# Patient Record
Sex: Female | Born: 1935 | Race: Black or African American | Hispanic: No | Smoking: Never smoker
Health system: Southern US, Community
[De-identification: ages and names within clinical notes are randomized; demographics above are authoritative.]

## PROBLEM LIST (undated history)

## (undated) DIAGNOSIS — I639 Cerebral infarction, unspecified: Secondary | ICD-10-CM

## (undated) DIAGNOSIS — M1990 Unspecified osteoarthritis, unspecified site: Secondary | ICD-10-CM

## (undated) DIAGNOSIS — I10 Essential (primary) hypertension: Secondary | ICD-10-CM

## (undated) HISTORY — PX: ABDOMINAL HYSTERECTOMY: SHX81

## (undated) HISTORY — PX: CSF SHUNT: SHX92

---

## 2016-05-07 ENCOUNTER — Encounter (HOSPITAL_COMMUNITY): Payer: Self-pay | Admitting: *Deleted

## 2016-05-07 DIAGNOSIS — R1011 Right upper quadrant pain: Secondary | ICD-10-CM | POA: Insufficient documentation

## 2016-05-07 DIAGNOSIS — R1031 Right lower quadrant pain: Secondary | ICD-10-CM | POA: Insufficient documentation

## 2016-05-07 DIAGNOSIS — I10 Essential (primary) hypertension: Secondary | ICD-10-CM | POA: Diagnosis not present

## 2016-05-07 DIAGNOSIS — Z8673 Personal history of transient ischemic attack (TIA), and cerebral infarction without residual deficits: Secondary | ICD-10-CM | POA: Diagnosis not present

## 2016-05-07 LAB — COMPREHENSIVE METABOLIC PANEL
ALK PHOS: 78 U/L (ref 38–126)
ALT: 11 U/L — AB (ref 14–54)
ANION GAP: 11 (ref 5–15)
AST: 14 U/L — ABNORMAL LOW (ref 15–41)
Albumin: 3.9 g/dL (ref 3.5–5.0)
BUN: 16 mg/dL (ref 6–20)
CALCIUM: 9.5 mg/dL (ref 8.9–10.3)
CO2: 24 mmol/L (ref 22–32)
CREATININE: 1.32 mg/dL — AB (ref 0.44–1.00)
Chloride: 103 mmol/L (ref 101–111)
GFR, EST AFRICAN AMERICAN: 41 mL/min — AB (ref >60)
GFR, EST NON AFRICAN AMERICAN: 36 mL/min — AB (ref >60)
Glucose, Bld: 102 mg/dL — ABNORMAL HIGH (ref 65–99)
Potassium: 3.6 mmol/L (ref 3.5–5.1)
SODIUM: 138 mmol/L (ref 135–145)
Total Bilirubin: 0.6 mg/dL (ref 0.3–1.2)
Total Protein: 7.2 g/dL (ref 6.5–8.1)

## 2016-05-07 LAB — URINE MICROSCOPIC-ADD ON

## 2016-05-07 LAB — URINALYSIS, ROUTINE W REFLEX MICROSCOPIC
Bilirubin Urine: NEGATIVE
Glucose, UA: NEGATIVE mg/dL
Hgb urine dipstick: NEGATIVE
Ketones, ur: NEGATIVE mg/dL
LEUKOCYTES UA: NEGATIVE
NITRITE: NEGATIVE
PROTEIN: 30 mg/dL — AB
SPECIFIC GRAVITY, URINE: 1.02 (ref 1.005–1.030)
pH: 6 (ref 5.0–8.0)

## 2016-05-07 LAB — CBC
HCT: 34.3 % — ABNORMAL LOW (ref 36.0–46.0)
HEMOGLOBIN: 10.6 g/dL — AB (ref 12.0–15.0)
MCH: 28.2 pg (ref 26.0–34.0)
MCHC: 30.9 g/dL (ref 30.0–36.0)
MCV: 91.2 fL (ref 78.0–100.0)
PLATELETS: 184 10*3/uL (ref 150–400)
RBC: 3.76 MIL/uL — AB (ref 3.87–5.11)
RDW: 16.3 % — ABNORMAL HIGH (ref 11.5–15.5)
WBC: 7.1 10*3/uL (ref 4.0–10.5)

## 2016-05-07 LAB — LIPASE, BLOOD: LIPASE: 36 U/L (ref 11–51)

## 2016-05-07 NOTE — ED Triage Notes (Signed)
Pt c/o intermittent sharp right side abdominal pain starting today. Pt was sitting on the couch when pain started. Last BM yesterday. Pt denies dysuria, hematuria.

## 2016-05-08 ENCOUNTER — Emergency Department (HOSPITAL_COMMUNITY)
Admission: EM | Admit: 2016-05-08 | Discharge: 2016-05-08 | Disposition: A | Discharge status: Home / Self Care | Payer: Medicare Other | Attending: Emergency Medicine | Admitting: Emergency Medicine

## 2016-05-08 ENCOUNTER — Emergency Department (HOSPITAL_COMMUNITY): Payer: Medicare Other

## 2016-05-08 DIAGNOSIS — R109 Unspecified abdominal pain: Secondary | ICD-10-CM

## 2016-05-08 DIAGNOSIS — R1031 Right lower quadrant pain: Secondary | ICD-10-CM | POA: Diagnosis not present

## 2016-05-08 HISTORY — DX: Unspecified osteoarthritis, unspecified site: M19.90

## 2016-05-08 HISTORY — DX: Cerebral infarction, unspecified: I63.9

## 2016-05-08 HISTORY — DX: Essential (primary) hypertension: I10

## 2016-05-08 MED ORDER — FENTANYL CITRATE (PF) 100 MCG/2ML IJ SOLN
25.0000 ug | Freq: Once | INTRAMUSCULAR | Status: AC
  Administered 2016-05-08: 25 ug via INTRAVENOUS
  Filled 2016-05-08: qty 2

## 2016-05-08 MED ORDER — IOPAMIDOL (ISOVUE-300) INJECTION 61%
INTRAVENOUS | Status: AC
  Administered 2016-05-08: 80 mL
  Filled 2016-05-08: qty 100

## 2016-05-08 MED ORDER — ONDANSETRON HCL 4 MG/2ML IJ SOLN
4.0000 mg | Freq: Once | INTRAMUSCULAR | Status: AC
  Administered 2016-05-08: 4 mg via INTRAVENOUS
  Filled 2016-05-08: qty 2

## 2016-05-08 NOTE — Discharge Instructions (Signed)
Take tylenol for pain. Follow up with your doctor closely. Return if pain worsening.

## 2016-05-08 NOTE — ED Provider Notes (Signed)
[facility] DEPT Provider Note   CSN: [id number] Arrival date & time: 05/07/16  1939     History   Chief Complaint Chief Complaint  Patient presents with  . Abdominal Pain    HPI Elizabeth Hubbard is a 82 y.o. female.  HPI Elizabeth Hubbard is a 82 y.o. female presents to emergency department with complaint of right lower abdominal pain. Patient states pain started several days ago, but worsened today. She denies any urinary symptoms. Had a bowel movement in the waiting room that was normal. Denies any nausea or vomiting. States pain comes and goes, describes pain as sharp. States movement does make pain worse. Did not take any medications for pain prior to coming in. Patient does report history of kidney stones, and does not remember what it felt like. 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  Past Medical History:  Diagnosis Date  . Arthritis   . Hypertension   . Stroke [facility])     There are no active problems to display for this patient.   Past Surgical History:  Procedure Laterality Date  . ABDOMINAL HYSTERECTOMY    . CSF SHUNT      OB History    No data available       Home Medications    Prior to Admission medications   Not on File    Family History History reviewed. No pertinent family history.  Social History Social History  Substance Use Topics  . Smoking status: Never Smoker  . Smokeless tobacco: Never Used  . Alcohol use No     Allergies   Review of patient's allergies indicates not on file.   Review of Systems Review of Systems  Constitutional: Negative for chills and fever.  Respiratory: Negative for cough, chest tightness and shortness of breath.   Cardiovascular: Negative for chest pain, palpitations and leg swelling.  Gastrointestinal: Positive for abdominal pain. Negative for diarrhea, nausea and vomiting.  Genitourinary: Negative for dysuria, flank pain, pelvic pain, vaginal bleeding, vaginal discharge and vaginal pain.    Musculoskeletal: Negative for arthralgias, myalgias, neck pain and neck stiffness.  Skin: Negative for rash.  Neurological: Negative for dizziness, weakness and headaches.  All other systems reviewed and are negative.    Physical Exam Updated Vital Signs BP 152/75   Pulse 83   Temp 97.9 F (36.6 C) (Oral)   Resp 16   Ht 5' (1.524 m)   Wt 46.7 kg   SpO2 100%   BMI 20.12 kg/m   Physical Exam  Constitutional: She appears well-developed and well-nourished. No distress.  HENT:  Head: Normocephalic.  Eyes: Conjunctivae are normal.  Neck: Neck supple.  Cardiovascular: Normal rate, regular rhythm and normal heart sounds.   Pulmonary/Chest: Effort normal and breath sounds normal. No respiratory distress. She has no wheezes. She has no rales.  Abdominal: Soft. Bowel sounds are normal. She exhibits no distension. There is tenderness. There is no rebound and no guarding.  Right upper quadrant and right lower quadrant tenderness. Right CVA tenderness.  Musculoskeletal: She exhibits no edema.  Neurological: She is alert.  Skin: Skin is warm and dry.  Psychiatric: She has a normal mood and affect. Her behavior is normal.  Nursing note and vitals reviewed.    ED Treatments / Results  Labs (all labs ordered are listed, but only abnormal results are displayed) Labs Reviewed  COMPREHENSIVE METABOLIC PANEL - Abnormal; Notable for the following:       Result Value   Glucose, Bld  102 (*)    Creatinine, Ser 1.32 (*)    AST 14 (*)    ALT 11 (*)    GFR calc non Af Amer 36 (*)    GFR calc Af Amer 41 (*)    All other components within normal limits  CBC - Abnormal; Notable for the following:    RBC 3.76 (*)    Hemoglobin 10.6 (*)    HCT 34.3 (*)    RDW 16.3 (*)    All other components within normal limits  URINALYSIS, ROUTINE W REFLEX MICROSCOPIC (NOT AT [facility]) - Abnormal; Notable for the following:    Protein, ur 30 (*)    All other components within normal limits  URINE  MICROSCOPIC-ADD ON - Abnormal; Notable for the following:    Squamous Epithelial / LPF 0-5 (*)    Bacteria, UA RARE (*)    All other components within normal limits  LIPASE, BLOOD    EKG  EKG Interpretation None       Radiology Ct Abdomen Pelvis W Contrast  Result Date: 05/08/2016 CLINICAL DATA:  Right lower abdominal pain, onset today. EXAM: CT ABDOMEN AND PELVIS WITH CONTRAST TECHNIQUE: Multidetector CT imaging of the abdomen and pelvis was performed using the standard protocol following bolus administration of intravenous contrast. CONTRAST:  80mL ISOVUE-300 IOPAMIDOL (ISOVUE-300) INJECTION 61% COMPARISON:  None. FINDINGS: Lower chest:  No significant abnormality Hepatobiliary: There are normal appearances of the liver, gallbladder and bile ducts. Pancreas: Normal Spleen: Normal Adrenals/Urinary Tract: Both adrenals are normal. There is a 3 mm lower pole collecting system calculus in each kidney, nonobstructing. There is a 1.5 cm lower pole left renal cyst. No significant parenchymal renal lesion. Ureters are unremarkable. Urinary bladder is unremarkable. Stomach/Bowel: There are normal appearances of the stomach, small bowel and colon. The appendix is normal. Vascular/Lymphatic: The abdominal aorta is normal in caliber. There is mild atherosclerotic calcification. There is no adenopathy in the abdomen or pelvis. Reproductive: Prior hysterectomy.  No adnexal abnormalities. Other: Ventricular peritoneal shunt tubing noted. Musculoskeletal: No significant skeletal lesion. Moderately severe lower lumbar degenerative facet disease and degenerative disc disease is present. Grade 1 degenerative appearing spondylolisthesis at L3-4. IMPRESSION: 1. No acute findings are evident in the abdomen or pelvis. Normal appendix. 2. Lower pole nephrolithiasis bilaterally. Electronically Signed   By: Ellery Plunkaniel R Mitchell M.D.   On: 05/08/2016 04:02    Procedures Procedures (including critical care  time)  Medications Ordered in ED Medications  fentaNYL (SUBLIMAZE) injection 25 mcg (not administered)  ondansetron (ZOFRAN) injection 4 mg (not administered)     Initial Impression / Assessment and Plan / ED Course  I have reviewed the triage vital signs and the nursing notes.  Pertinent labs & imaging results that were available during my care of the patient were reviewed by me and considered in my medical decision making (see chart for details).  Clinical Course    1:58 AM Pt with lower abdominal pain in the last several days. Pt's labs unremarkable. UA with no signs if infection or hematuria. Will get CT abd/plevis for evaluation. Pain medications ordered.   4:37 AM CT is negative. Pt feels much better. No pain at this time. Question passed kidney stone. Will dc home with tylenol. Follow u with primary care doctor as needed return precautions discussed. At time of discharge, abdomen is soft, non tender. VS show hypertension, otherwise normal. She is afebrile.   Vitals:   05/07/16 1955 05/07/16 2219 05/08/16 0200 05/08/16 0420  BP: 177/65  152/75 161/75 162/98  Pulse: 94 83 82 92  Resp: 20 16 16 16   Temp: 97.9 F (36.6 C)     TempSrc: Oral     SpO2: 100% 100% 100% 100%  Weight: 46.7 kg     Height: 5' (1.524 m)        Final Clinical Impressions(s) / ED Diagnoses   Final diagnoses:  Abdominal pain, unspecified abdominal location    New Prescriptions New Prescriptions   No medications on file     Jaynie Crumble, PA-C 05/08/16 1610    Shon Baton, MD 05/16/16 [phone]

## 2016-05-08 NOTE — ED Notes (Signed)
Patient transported to CT scan . 

## 2016-05-10 ENCOUNTER — Emergency Department (HOSPITAL_COMMUNITY)
Admission: EM | Admit: 2016-05-10 | Discharge: 2016-05-10 | Disposition: A | Discharge status: Home / Self Care | Payer: Medicare Other | Attending: Emergency Medicine | Admitting: Emergency Medicine

## 2016-05-10 ENCOUNTER — Emergency Department (HOSPITAL_COMMUNITY): Payer: Medicare Other

## 2016-05-10 ENCOUNTER — Encounter (HOSPITAL_COMMUNITY): Payer: Self-pay | Admitting: Emergency Medicine

## 2016-05-10 DIAGNOSIS — I10 Essential (primary) hypertension: Secondary | ICD-10-CM | POA: Insufficient documentation

## 2016-05-10 DIAGNOSIS — R1011 Right upper quadrant pain: Secondary | ICD-10-CM

## 2016-05-10 DIAGNOSIS — Z8673 Personal history of transient ischemic attack (TIA), and cerebral infarction without residual deficits: Secondary | ICD-10-CM | POA: Insufficient documentation

## 2016-05-10 DIAGNOSIS — K824 Cholesterolosis of gallbladder: Secondary | ICD-10-CM | POA: Diagnosis not present

## 2016-05-10 LAB — COMPREHENSIVE METABOLIC PANEL
ALT: 11 U/L — AB (ref 14–54)
AST: 20 U/L (ref 15–41)
Albumin: 4 g/dL (ref 3.5–5.0)
Alkaline Phosphatase: 73 U/L (ref 38–126)
Anion gap: 11 (ref 5–15)
BILIRUBIN TOTAL: 0.3 mg/dL (ref 0.3–1.2)
BUN: 17 mg/dL (ref 6–20)
CALCIUM: 9.8 mg/dL (ref 8.9–10.3)
CO2: 24 mmol/L (ref 22–32)
CREATININE: 1.34 mg/dL — AB (ref 0.44–1.00)
Chloride: 104 mmol/L (ref 101–111)
GFR, EST AFRICAN AMERICAN: 41 mL/min — AB (ref >60)
GFR, EST NON AFRICAN AMERICAN: 35 mL/min — AB (ref >60)
Glucose, Bld: 95 mg/dL (ref 65–99)
Potassium: 4.2 mmol/L (ref 3.5–5.1)
Sodium: 139 mmol/L (ref 135–145)
TOTAL PROTEIN: 7.9 g/dL (ref 6.5–8.1)

## 2016-05-10 LAB — URINALYSIS, ROUTINE W REFLEX MICROSCOPIC
Bilirubin Urine: NEGATIVE
GLUCOSE, UA: NEGATIVE mg/dL
Hgb urine dipstick: NEGATIVE
KETONES UR: NEGATIVE mg/dL
LEUKOCYTES UA: NEGATIVE
NITRITE: NEGATIVE
PROTEIN: 30 mg/dL — AB
Specific Gravity, Urine: 1.017 (ref 1.005–1.030)
pH: 6 (ref 5.0–8.0)

## 2016-05-10 LAB — URINE MICROSCOPIC-ADD ON: Bacteria, UA: NONE SEEN

## 2016-05-10 LAB — CBC
HCT: 36.4 % (ref 36.0–46.0)
Hemoglobin: 11.4 g/dL — ABNORMAL LOW (ref 12.0–15.0)
MCH: 28.4 pg (ref 26.0–34.0)
MCHC: 31.3 g/dL (ref 30.0–36.0)
MCV: 90.8 fL (ref 78.0–100.0)
PLATELETS: 172 10*3/uL (ref 150–400)
RBC: 4.01 MIL/uL (ref 3.87–5.11)
RDW: 16.2 % — AB (ref 11.5–15.5)
WBC: 6.5 10*3/uL (ref 4.0–10.5)

## 2016-05-10 LAB — LIPASE, BLOOD: Lipase: 41 U/L (ref 11–51)

## 2016-05-10 MED ORDER — TRAMADOL HCL 50 MG PO TABS: 50.0000 mg | ORAL_TABLET | Freq: Three times a day (TID) | ORAL | 0 refills | Status: AC | PRN

## 2016-05-10 MED ORDER — DOCUSATE SODIUM 100 MG PO CAPS: 100.0000 mg | ORAL_CAPSULE | Freq: Two times a day (BID) | ORAL | 0 refills | Status: AC

## 2016-05-10 NOTE — ED Triage Notes (Signed)
Pt state she was seen here a few days ago for the same thing, had labs, and CT scan done then without finding anything. Pt told to follow up with PCP and is from out of town and has been unable to do that. Pt c/o continued pain.

## 2016-05-10 NOTE — ED Notes (Signed)
Pt voided in brief. Brief changed, perineal area cleaned with warm washcloth, new brief applied with chuck underneath.

## 2016-05-10 NOTE — ED Provider Notes (Signed)
[facility] Provider Note   CSN: [id number] Arrival date & time: 05/10/16  1352     History   Chief Complaint Chief Complaint  Patient presents with  . Abdominal Pain    HPI Elizabeth Hubbard is a 82 y.o. female.  The history is provided by the patient and a relative.  Abdominal Pain   This is a new problem. The current episode started more than 2 days ago. The problem occurs daily. The problem has been gradually worsening. The pain is located in the RUQ. The pain is moderate. Associated symptoms include dysuria. Pertinent negatives include fever. The symptoms are aggravated by certain positions. Nothing relieves the symptoms.   Pt presents with abdominal pain She was seen for this pain in the ED earlier this week CT imaging was negative and she felt improved in the ED Her pain is now returned It is mostly in the RUQ No fever/vomiting No cp/sob No constipation She has never had this before  She is from [place] area, no local PCP She is here visiting family  Past Medical History:  Diagnosis Date  . Arthritis   . Hypertension   . Stroke [facility])     There are no active problems to display for this patient.   Past Surgical History:  Procedure Laterality Date  . ABDOMINAL HYSTERECTOMY    . CSF SHUNT      OB History    No data available       Home Medications    Prior to Admission medications   Medication Sig Start Date End Date Taking? Authorizing Provider  amLODipine (NORVASC) 5 MG tablet Take 5 mg by mouth daily.    Historical Provider, MD  atorvastatin (LIPITOR) 40 MG tablet Take 40 mg by mouth daily.    Historical Provider, MD  ferrous sulfate 325 (65 FE) MG tablet Take 325 mg by mouth daily with breakfast.    Historical Provider, MD  lacosamide (VIMPAT) 50 MG TABS tablet Take 50 mg by mouth 2 (two) times daily.    Historical Provider, MD  Multiple Vitamin (MULTIVITAMIN WITH MINERALS) TABS tablet Take 1 tablet by mouth daily.     Historical Provider, MD  polyethylene glycol (MIRALAX / GLYCOLAX) packet Take 17 g by mouth daily.    Historical Provider, MD    Family History No family history on file.  Social History Social History  Substance Use Topics  . Smoking status: Never Smoker  . Smokeless tobacco: Never Used  . Alcohol use No     Allergies   Aspirin; Diovan hct [valsartan-hydrochlorothiazide]; Doxycycline; Keflex [cephalexin]; Lisinopril; Penicillins; Percocet [oxycodone-acetaminophen]; Tetracyclines & related; and Tylenol with codeine #3 [acetaminophen-codeine]   Review of Systems Review of Systems  Constitutional: Negative for fever.  Cardiovascular: Negative for chest pain.  Gastrointestinal: Positive for abdominal pain. Negative for blood in stool.  Genitourinary: Positive for dysuria.  All other systems reviewed and are negative.    Physical Exam Updated Vital Signs BP 152/70   Pulse 86   Temp 97.9 F (36.6 C) (Oral)   Resp 18   Ht 5' (1.524 m)   Wt 46.7 kg   SpO2 100%   BMI 20.12 kg/m   Physical Exam CONSTITUTIONAL: Elderly, no acute distress HEAD: Normocephalic/atraumatic EYES: EOMI ENMT: Mucous membranes moist NECK: supple no meningeal signs SPINE/BACK:entire spine nontender CV: S1/S2 noted, no murmurs/rubs/gallops noted LUNGS: Lungs are clear to auscultation bilaterally, no apparent distress ABDOMEN: soft, moderate RUQ tenderness, no rebound or guarding, bowel sounds noted  throughout abdomen GU:no cva tenderness NEURO: Pt is awake/alert/appropriate, moves all extremitiesx4. EXTREMITIES: pulses normal/equal, full ROM SKIN: warm, color normal PSYCH: no abnormalities of mood noted, alert and oriented to situation   ED Treatments / Results  Labs (all labs ordered are listed, but only abnormal results are displayed) Labs Reviewed  COMPREHENSIVE METABOLIC PANEL - Abnormal; Notable for the following:       Result Value   Creatinine, Ser 1.34 (*)    ALT 11 (*)    GFR  calc non Af Amer 35 (*)    GFR calc Af Amer 41 (*)    All other components within normal limits  CBC - Abnormal; Notable for the following:    Hemoglobin 11.4 (*)    RDW 16.2 (*)    All other components within normal limits  URINALYSIS, ROUTINE W REFLEX MICROSCOPIC (NOT AT [facility]) - Abnormal; Notable for the following:    Protein, ur 30 (*)    All other components within normal limits  URINE MICROSCOPIC-ADD ON - Abnormal; Notable for the following:    Squamous Epithelial / LPF 0-5 (*)    All other components within normal limits  LIPASE, BLOOD    EKG  EKG Interpretation  Date/Time:  Thursday May 10 2016 18:02:33 EDT Ventricular Rate:  76 PR Interval:    QRS Duration: 92 QT Interval:  414 QTC Calculation: 466 R Axis:   77 Text Interpretation:  Sinus rhythm Atrial premature complex Probable anteroseptal infarct, old No previous ECGs available Confirmed by Bebe Shaggy  MD, Sandi Towe ([id number]) on 05/10/2016 6:40:09 PM       Radiology US Abdomen Limited Ruq  Result Date: 05/10/2016 CLINICAL DATA:  Right upper quadrant abdominal pain for 4 days. EXAM: US ABDOMEN LIMITED - RIGHT UPPER QUADRANT COMPARISON:  05/08/2016 CT scan FINDINGS: Gallbladder: No gallstones or wall thickening visualized. No sonographic Murphy sign noted by sonographer. There is a suspected 2 mm polyp in the neck of the gallbladder on image 12. Common bile duct: Diameter: 2 mm Liver: No focal lesion identified. Within normal limits in parenchymal echogenicity. Other: Note is made is some prominence of bowel contents, correlating with appearance of potential constipation on CT. IMPRESSION: 1. Prominence of bowel contents, review of CT raises the possibility of constipation, correlate with bowel habits. 2. 2 mm polyp in the neck of the gallbladder. 3. Otherwise normal sonographic appearance of the hepatobiliary system. Electronically Signed   By: Gaylyn Rong M.D.   On: 05/10/2016 18:56    Procedures Procedures  (including critical care time)  Medications Ordered in ED Medications - No data to display   Initial Impression / Assessment and Plan / ED Course  I have reviewed the triage vital signs and the nursing notes.  Pertinent labs  results that were available during my care of the patient were reviewed by me and considered in my medical decision making (see chart for details).  Clinical Course    Pt with continued abd pain, now localizing in RUQ Will obtain ultrasound Pt agreeable She does not want pain meds at this time    Pt stable in the ED US imaging reveals probable constipation as well as gallbladder polyp which could explain her pain symptoms At time of discharge, pt well appearing/no distress/smiling Daughters at bedside Will give short course of pain meds (she feels she can tolerate tramadol) Also recommend colace or other stool softener She will f/u with her PCP back in [place] when she returns in September   Final  Clinical Impressions(s) / ED Diagnoses   Final diagnoses:  RUQ pain  Gallbladder polyp    New Prescriptions New Prescriptions   No medications on file     Zadie Rhineonald Kathaleya Mcduffee, MD 05/11/16 0009

## 2016-05-10 NOTE — Discharge Instructions (Signed)
YOU HAVE A GALLBLADDER POLYP PLEASE SEE YOUR PRIMARY DOCTOR IN [place] FOR FURTHER TESTING/MANAGEMENT    SEEK IMMEDIATE MEDICAL ATTENTION IF: The pain does not go away or becomes severe, particularly over the next 8-12 hours.  A temperature above 100.22F develops.  Repeated vomiting occurs (multiple episodes).   Blood is being passed in stools or vomit (bright red or black tarry stools).  Return also if you develop chest pain, difficulty breathing, dizziness or fainting, or become confused, poorly responsive, or inconsolable.

## 2016-12-30 IMAGING — US US ABDOMEN LIMITED
1 series · 14 of 25 positions shown · non-contrast
Comparison: 05/08/2016 CT scan

CLINICAL DATA: Right upper quadrant abdominal pain for 4 days.

EXAM:
US ABDOMEN LIMITED - RIGHT UPPER QUADRANT

[Series 1: us abdomen limited · 0.15mm/px · 14 of 51 slices shown]
[im 1/51]
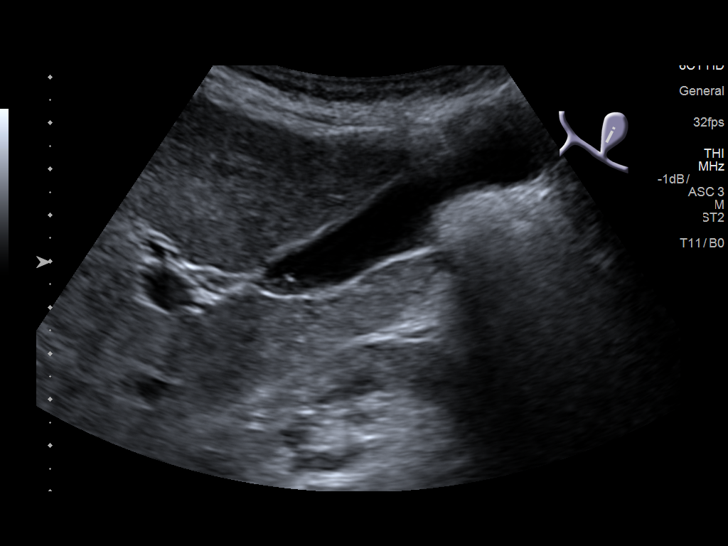
[im 5/51]
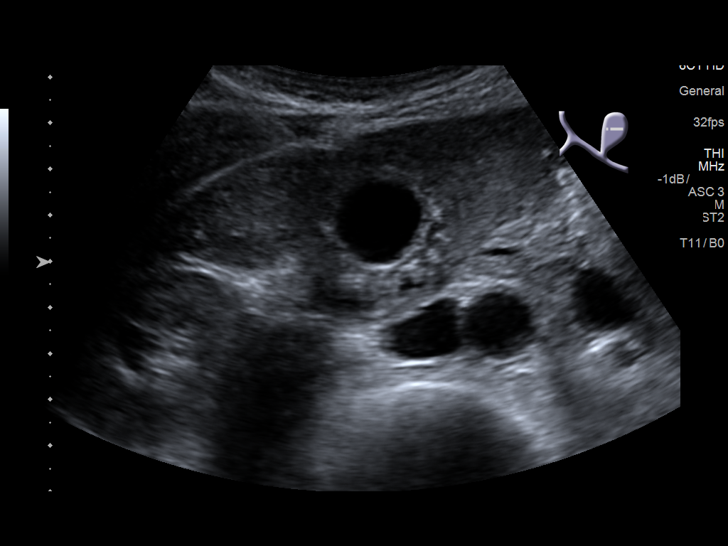
[im 9/51]
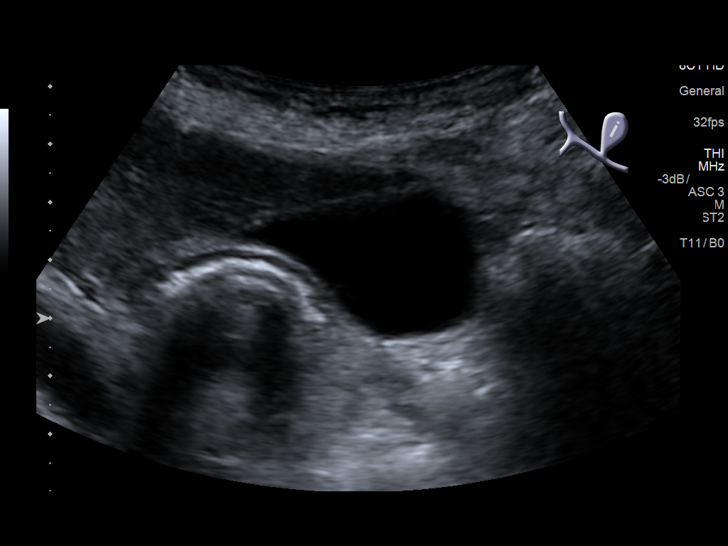
[im 13/51]
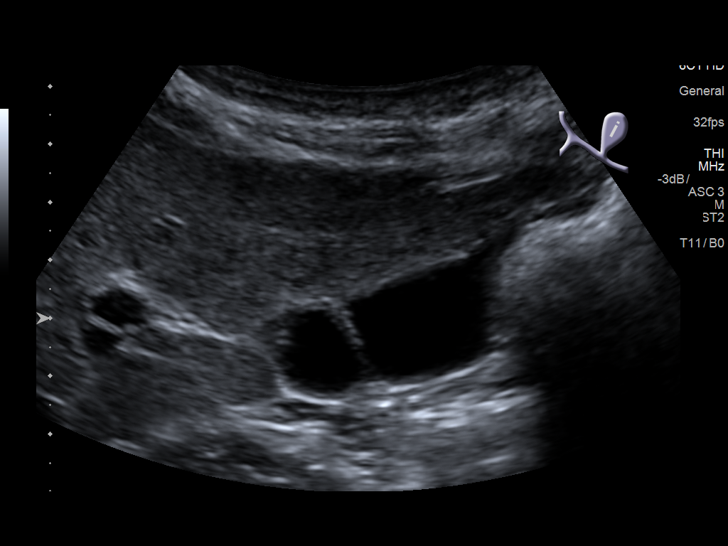
[im 17/51]
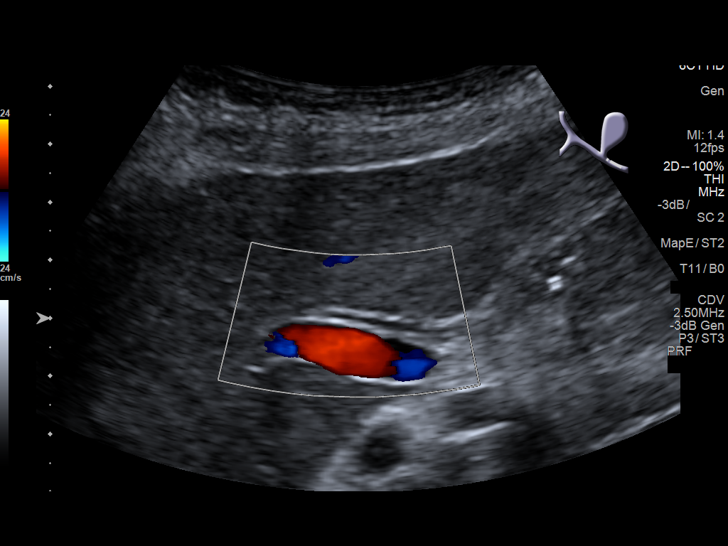
[im 19/51]
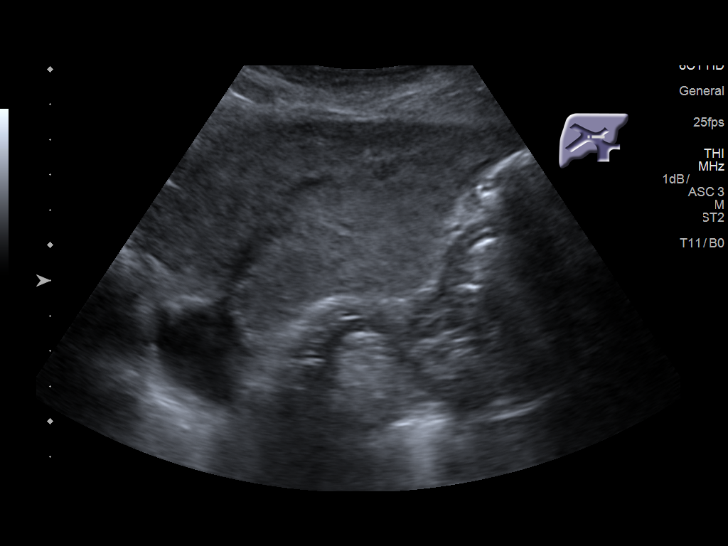
[im 23/51]
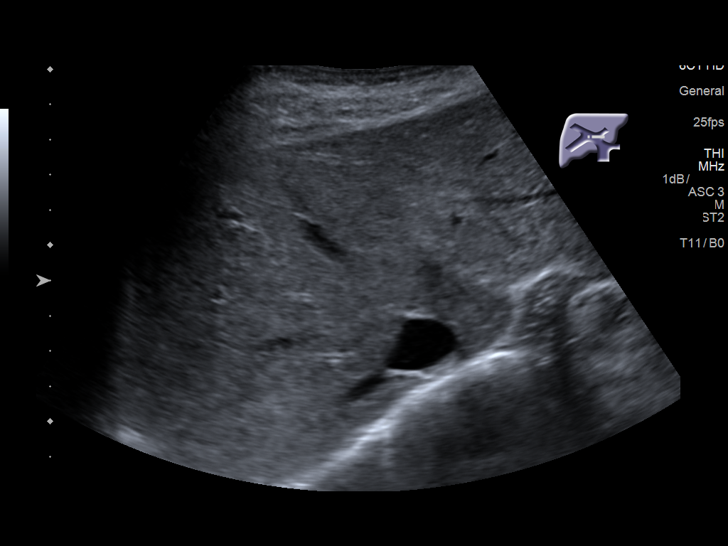
[im 28/51]
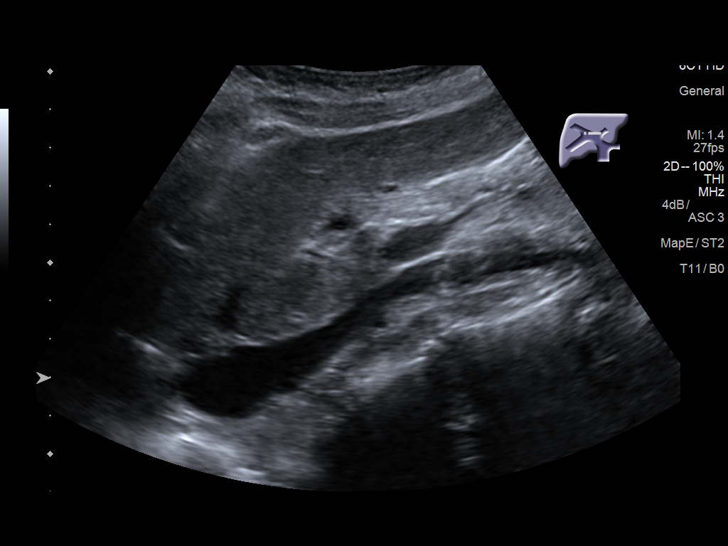
[im 32/51]
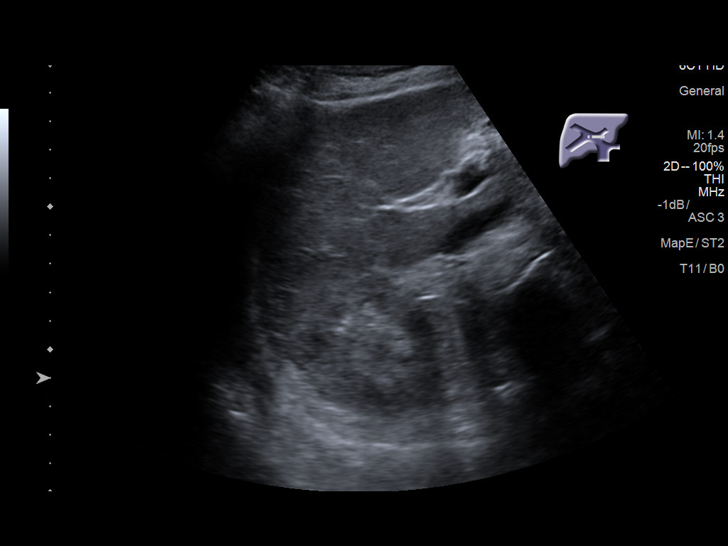
[im 34/51]
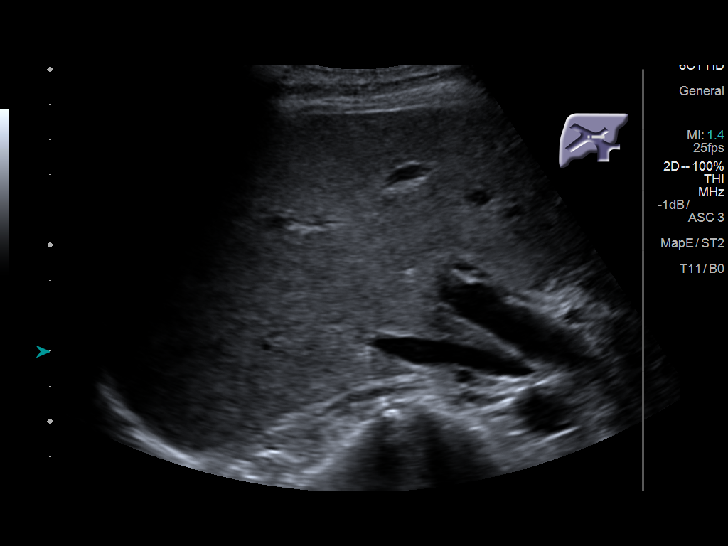
[im 38/51]
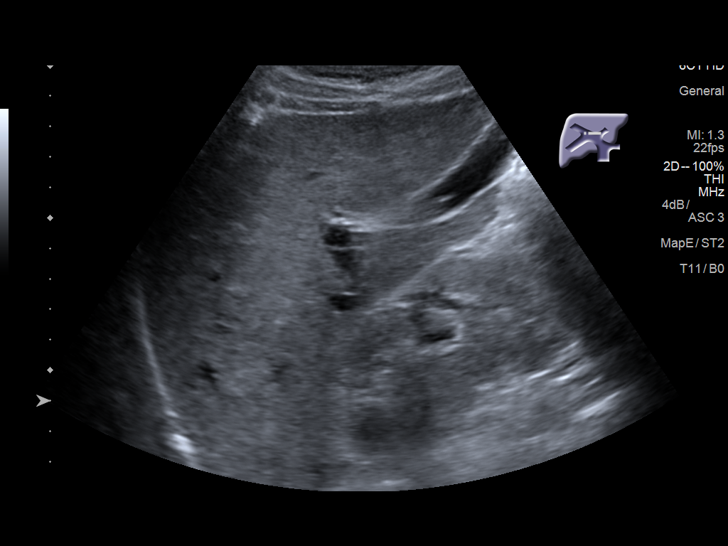
[im 42/51]
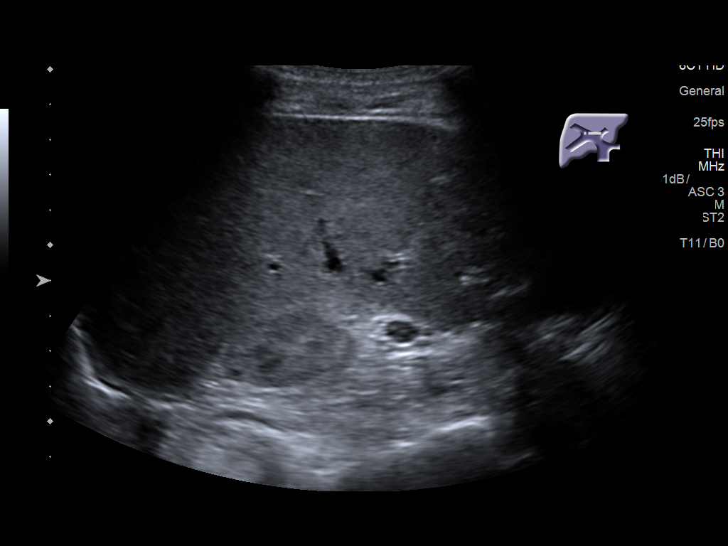
[im 46/51]
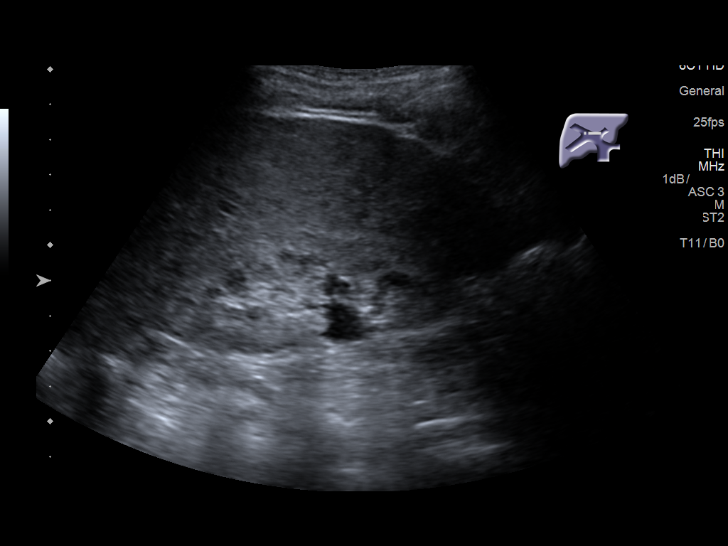
[im 51/51]
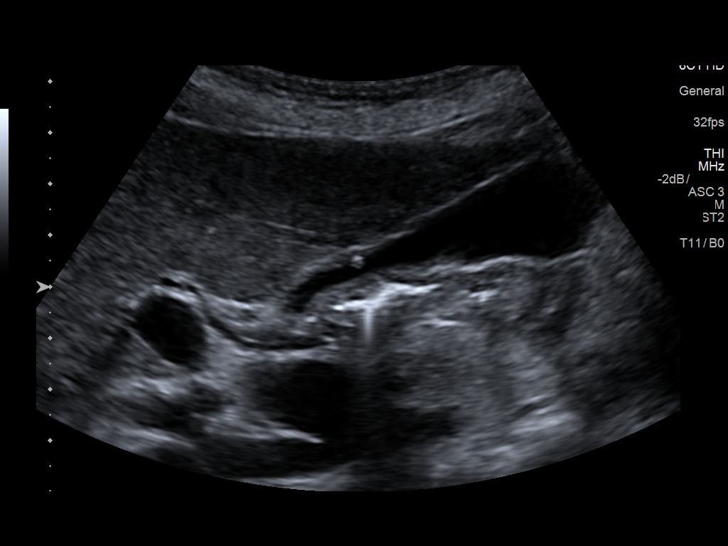

[14 of 25 positions shown; findings below may reference images not displayed]

FINDINGS: Gallbladder:

No gallstones or wall thickening visualized. No sonographic Murphy
sign noted by sonographer. There is a suspected 2 mm polyp in the
neck of the gallbladder on image 12.

Common bile duct:

Diameter: 2 mm

Liver:

No focal lesion identified. Within normal limits in parenchymal
echogenicity.

Other: Note is made is some prominence of bowel contents,
correlating with appearance of potential constipation on CT.
IMPRESSION: 1. Prominence of bowel contents, review of CT raises the possibility
of constipation, correlate with bowel habits.
2. 2 mm polyp in the neck of the gallbladder.
3. Otherwise normal sonographic appearance of the hepatobiliary
system.

## 2022-12-19 ENCOUNTER — Other Ambulatory Visit: Payer: Self-pay

## 2022-12-19 ENCOUNTER — Emergency Department (HOSPITAL_BASED_OUTPATIENT_CLINIC_OR_DEPARTMENT_OTHER): Payer: Medicare Other

## 2022-12-19 ENCOUNTER — Other Ambulatory Visit (HOSPITAL_BASED_OUTPATIENT_CLINIC_OR_DEPARTMENT_OTHER): Payer: Self-pay

## 2022-12-19 ENCOUNTER — Emergency Department (HOSPITAL_BASED_OUTPATIENT_CLINIC_OR_DEPARTMENT_OTHER)
Admission: EM | Admit: 2022-12-19 | Discharge: 2022-12-19 | Disposition: A | Discharge status: Home / Self Care | Payer: Medicare Other | Attending: Emergency Medicine | Admitting: Emergency Medicine

## 2022-12-19 DIAGNOSIS — F419 Anxiety disorder, unspecified: Secondary | ICD-10-CM | POA: Diagnosis not present

## 2022-12-19 DIAGNOSIS — Z79899 Other long term (current) drug therapy: Secondary | ICD-10-CM | POA: Diagnosis not present

## 2022-12-19 DIAGNOSIS — E860 Dehydration: Secondary | ICD-10-CM

## 2022-12-19 DIAGNOSIS — N289 Disorder of kidney and ureter, unspecified: Secondary | ICD-10-CM

## 2022-12-19 DIAGNOSIS — R5383 Other fatigue: Secondary | ICD-10-CM | POA: Diagnosis present

## 2022-12-19 LAB — COMPREHENSIVE METABOLIC PANEL
ALT: 9 U/L (ref 0–44)
AST: 21 U/L (ref 15–41)
Albumin: 4.1 g/dL (ref 3.5–5.0)
Alkaline Phosphatase: 54 U/L (ref 38–126)
Anion gap: 10 (ref 5–15)
BUN: 29 mg/dL — ABNORMAL HIGH (ref 8–23)
CO2: 20 mmol/L — ABNORMAL LOW (ref 22–32)
Calcium: 9.8 mg/dL (ref 8.9–10.3)
Chloride: 108 mmol/L (ref 98–111)
Creatinine, Ser: 1.29 mg/dL — ABNORMAL HIGH (ref 0.44–1.00)
GFR, Estimated: 39 mL/min — ABNORMAL LOW (ref >60)
Glucose, Bld: 107 mg/dL — ABNORMAL HIGH (ref 70–99)
Potassium: 4.3 mmol/L (ref 3.5–5.1)
Sodium: 138 mmol/L (ref 135–145)
Total Bilirubin: 0.6 mg/dL (ref 0.3–1.2)
Total Protein: 7.4 g/dL (ref 6.5–8.1)

## 2022-12-19 LAB — URINALYSIS, ROUTINE W REFLEX MICROSCOPIC
Bacteria, UA: NONE SEEN
Bilirubin Urine: NEGATIVE
Glucose, UA: NEGATIVE mg/dL
Hgb urine dipstick: NEGATIVE
Ketones, ur: NEGATIVE mg/dL
Leukocytes,Ua: NEGATIVE
Nitrite: NEGATIVE
Protein, ur: 30 mg/dL — AB
Specific Gravity, Urine: 1.017 (ref 1.005–1.030)
pH: 5.5 (ref 5.0–8.0)

## 2022-12-19 LAB — CBC WITH DIFFERENTIAL/PLATELET
Abs Immature Granulocytes: 0.04 10*3/uL (ref 0.00–0.07)
Basophils Absolute: 0 10*3/uL (ref 0.0–0.1)
Basophils Relative: 0 %
Eosinophils Absolute: 0.1 10*3/uL (ref 0.0–0.5)
Eosinophils Relative: 1 %
HCT: 37.2 % (ref 36.0–46.0)
Hemoglobin: 11.9 g/dL — ABNORMAL LOW (ref 12.0–15.0)
Immature Granulocytes: 1 %
Lymphocytes Relative: 11 %
Lymphs Abs: 0.8 10*3/uL (ref 0.7–4.0)
MCH: 31.2 pg (ref 26.0–34.0)
MCHC: 32 g/dL (ref 30.0–36.0)
MCV: 97.4 fL (ref 80.0–100.0)
Monocytes Absolute: 0.3 10*3/uL (ref 0.1–1.0)
Monocytes Relative: 4 %
Neutro Abs: 6.3 10*3/uL (ref 1.7–7.7)
Neutrophils Relative %: 83 %
Platelets: 120 10*3/uL — ABNORMAL LOW (ref 150–400)
RBC: 3.82 MIL/uL — ABNORMAL LOW (ref 3.87–5.11)
RDW: 14.9 % (ref 11.5–15.5)
WBC: 7.5 10*3/uL (ref 4.0–10.5)
nRBC: 0 % (ref 0.0–0.2)

## 2022-12-19 MED ORDER — SODIUM CHLORIDE 0.9 % IV BOLUS
500.0000 mL | Freq: Once | INTRAVENOUS | Status: AC
  Administered 2022-12-19: 500 mL via INTRAVENOUS

## 2022-12-19 NOTE — Discharge Instructions (Addendum)
Increase the water that you drink.  Make sure to take your medications with plenty of water.  See your physicain for recheck of your kidney functions

## 2022-12-20 NOTE — ED Provider Notes (Signed)
[place] EMERGENCY DEPARTMENT AT [facility] Provider Note   CSN: [id number] Arrival date & time: 12/19/22  1434     History  Chief Complaint  Patient presents with   Fatigue   Anxiety    Elizabeth Hubbard is a 89 y.o. female.  Daughter reports that she took patient to [employer] today.  Patient complained of feeling wobbly while they were walking around.  Patient's daughter reports that patient has multiple complaints she states that her mother is a complainer.  She reports that patient took her blood pressure pill earlier today and was concerned that it was stuck in her throat.  Patient reports feeling an area of discomfort in her throat and still thinks that there is a pill there.  The history is provided by the patient and a relative. A language interpreter was used.  Anxiety       Home Medications Prior to Admission medications   Medication Sig Start Date End Date Taking? Authorizing Provider  amLODipine (NORVASC) 5 MG tablet Take 5 mg by mouth daily.    [provider]  atorvastatin (LIPITOR) 40 MG tablet Take 40 mg by mouth daily.    [provider]  docusate sodium (COLACE) 100 MG capsule Take 1 capsule (100 mg total) by mouth every 12 (twelve) hours. 05/10/16   Zadie Rhine, MD  ferrous sulfate 325 (65 FE) MG tablet Take 325 mg by mouth daily with breakfast.    [provider]  Multiple Vitamin (MULTIVITAMIN WITH MINERALS) TABS tablet Take 1 tablet by mouth daily.    [provider]  traMADol (ULTRAM) 50 MG tablet Take 1 tablet (50 mg total) by mouth every 8 (eight) hours as needed for moderate pain or severe pain. 05/10/16   Zadie Rhine, MD      Allergies    Aspirin, Diovan hct [valsartan-hydrochlorothiazide], Doxycycline, Keflex [cephalexin], Lisinopril, Penicillins, Percocet [oxycodone-acetaminophen], Tetracyclines & related, and Tylenol with codeine #3 [acetaminophen-codeine]    Review of Systems   Review of  Systems  HENT:  Positive for sore throat.   Neurological:  Positive for weakness.  All other systems reviewed and are negative.   Physical Exam Updated Vital Signs BP (!) 128/57   Pulse 73   Temp 98 F (36.7 C) (Oral)   Resp 16   Ht 5' (1.524 m)   Wt 48 kg   SpO2 98%   BMI 20.67 kg/m  Physical Exam Vitals and nursing note reviewed.  Constitutional:      General: She is not in acute distress.    Appearance: She is well-developed.  HENT:     Head: Normocephalic and atraumatic.  Eyes:     Conjunctiva/sclera: Conjunctivae normal.  Cardiovascular:     Rate and Rhythm: Normal rate and regular rhythm.     Heart sounds: No murmur heard. Pulmonary:     Effort: Pulmonary effort is normal. No respiratory distress.     Breath sounds: Normal breath sounds.  Abdominal:     Palpations: Abdomen is soft.     Tenderness: There is no abdominal tenderness.  Musculoskeletal:        General: No swelling.     Cervical back: Neck supple.  Skin:    General: Skin is warm and dry.     Capillary Refill: Capillary refill takes less than 2 seconds.  Neurological:     General: No focal deficit present.     Mental Status: She is alert and oriented to person, place, and time.  Psychiatric:        Mood and Affect: Mood normal.        Behavior: Behavior normal.     ED Results / Procedures / Treatments   Labs (all labs ordered are listed, but only abnormal results are displayed) Labs Reviewed  CBC WITH DIFFERENTIAL/PLATELET - Abnormal; Notable for the following components:      Result Value   RBC 3.82 (*)    Hemoglobin 11.9 (*)    Platelets 120 (*)    All other components within normal limits  COMPREHENSIVE METABOLIC PANEL - Abnormal; Notable for the following components:   CO2 20 (*)    Glucose, Bld 107 (*)    BUN 29 (*)    Creatinine, Ser 1.29 (*)    GFR, Estimated 39 (*)    All other components within normal limits  URINALYSIS, ROUTINE W REFLEX MICROSCOPIC - Abnormal; Notable for  the following components:   Protein, ur 30 (*)    All other components within normal limits    EKG EKG Interpretation  Date/Time:  Wednesday December 19 2022 15:45:39 EDT Ventricular Rate:  64 PR Interval:  155 QRS Duration: 92 QT Interval:  426 QTC Calculation: 440 R Axis:   81 Text Interpretation: Sinus rhythm Atrial premature complexes in couplets Anterior infarct, old No significant change since last tracing Confirmed by Vivien Rossetti ([id number]) on 12/19/2022 4:05:01 PM  Radiology DG Chest Port 1 View  Result Date: 12/19/2022 CLINICAL DATA:  Fatigue and anxiety.  Possible aspiration. EXAM: PORTABLE CHEST 1 VIEW COMPARISON:  None Available. FINDINGS: UPPER limits normal heart size noted. There is no evidence of focal airspace disease, pulmonary edema, suspicious pulmonary nodule/mass, pleural effusion, or pneumothorax. No acute bony abnormalities are identified. Catheter overlying the RIGHT chest noted. IMPRESSION: No active disease. Electronically Signed   By: Harmon Pier M.D.   On: 12/19/2022 16:39    Procedures Procedures    Medications Ordered in ED Medications  sodium chloride 0.9 % bolus 500 mL (0 mLs Intravenous Stopped 12/19/22 1804)    ED Course/ Medical Decision Making/ A&P Clinical Course as of 12/20/22 1159  Wed Dec 19, 2022  1639 CO2(!): 20 [VB]    Clinical Course User Index [VB] Mardene Sayer, MD                             Medical Decision Making [place]anes of feeling wobbly and feeling like she may have a pill stuck in her throat  Amount and/or Complexity of Data Reviewed Independent Historian: caregiver    Details: And is here with her daughter who is supportive Labs: ordered. Decision-making details documented in ED Course.    Details: Labs ordered reviewed and interpreted labs ordered reviewed and interpreted patient has a slight increase in her BUN and creatinine Radiology: ordered and independent interpretation performed. Decision-making details  documented in ED Course.    Details: Chest x-ray is negative ECG/medicine tests: ordered and independent interpretation performed. Decision-making details documented in ED Course.    Details: KG shows no acute changes  Risk Risk Details: Patient given IV fluids x 500 cc.  Patient reevaluated patient looks well overall she is able to eat and drink without difficulty she may have a pill esophagitis however I do not think that she has an esophageal foreign body or obstruction.  Patient is encouraged to increase her fluid intake.  I advised that she needs to have her primary care physician to  recheck her kidney functions in 1 week.           Final Clinical Impression(s) / ED Diagnoses Final diagnoses:  Dehydration  Renal insufficiency    Rx / DC Orders ED Discharge Orders     None      An After Visit Summary was printed and given to the patient.    Osie CheeksSofia, Ligaya Cormier K, PA-C 12/20/22 1202    Mardene SayerBranham, Victoria C, MD 12/21/22 1247

## 2023-02-21 ENCOUNTER — Emergency Department (HOSPITAL_BASED_OUTPATIENT_CLINIC_OR_DEPARTMENT_OTHER): Payer: Medicare Other

## 2023-02-21 ENCOUNTER — Emergency Department (HOSPITAL_BASED_OUTPATIENT_CLINIC_OR_DEPARTMENT_OTHER): Payer: Medicare Other | Admitting: Radiology

## 2023-02-21 ENCOUNTER — Emergency Department (HOSPITAL_BASED_OUTPATIENT_CLINIC_OR_DEPARTMENT_OTHER)
Admission: EM | Admit: 2023-02-21 | Discharge: 2023-02-21 | Disposition: A | Discharge status: Home / Self Care | Payer: Medicare Other | Attending: Emergency Medicine | Admitting: Emergency Medicine

## 2023-02-21 ENCOUNTER — Other Ambulatory Visit: Payer: Self-pay

## 2023-02-21 ENCOUNTER — Encounter (HOSPITAL_BASED_OUTPATIENT_CLINIC_OR_DEPARTMENT_OTHER): Payer: Self-pay | Admitting: Emergency Medicine

## 2023-02-21 DIAGNOSIS — Z79899 Other long term (current) drug therapy: Secondary | ICD-10-CM | POA: Diagnosis not present

## 2023-02-21 DIAGNOSIS — S2232XA Fracture of one rib, left side, initial encounter for closed fracture: Secondary | ICD-10-CM | POA: Diagnosis not present

## 2023-02-21 DIAGNOSIS — R0781 Pleurodynia: Secondary | ICD-10-CM | POA: Diagnosis present

## 2023-02-21 DIAGNOSIS — W06XXXA Fall from bed, initial encounter: Secondary | ICD-10-CM | POA: Insufficient documentation

## 2023-02-21 DIAGNOSIS — M25552 Pain in left hip: Secondary | ICD-10-CM | POA: Diagnosis not present

## 2023-02-21 DIAGNOSIS — I10 Essential (primary) hypertension: Secondary | ICD-10-CM | POA: Diagnosis not present

## 2023-02-21 DIAGNOSIS — Z8673 Personal history of transient ischemic attack (TIA), and cerebral infarction without residual deficits: Secondary | ICD-10-CM | POA: Insufficient documentation

## 2023-02-21 MED ORDER — LIDOCAINE 5 % EX PTCH
1.0000 | MEDICATED_PATCH | Freq: Once | CUTANEOUS | Status: DC
  Administered 2023-02-21: 1 via TRANSDERMAL
  Filled 2023-02-21: qty 1

## 2023-02-21 MED ORDER — LIDOCAINE 5 % EX PTCH: 1.0000 | MEDICATED_PATCH | CUTANEOUS | 0 refills | Status: DC

## 2023-02-21 MED ORDER — ACETAMINOPHEN 500 MG PO TABS
1000.0000 mg | ORAL_TABLET | Freq: Once | ORAL | Status: AC
  Administered 2023-02-21: 1000 mg via ORAL
  Filled 2023-02-21: qty 2

## 2023-02-21 MED ORDER — LIDOCAINE 5 % EX PTCH: 1.0000 | MEDICATED_PATCH | CUTANEOUS | 0 refills | Status: AC

## 2023-02-21 NOTE — ED Provider Notes (Signed)
[place] EMERGENCY DEPARTMENT AT [facility] Provider Note   CSN: [id number] Arrival date & time: 02/21/23  1634     History  Chief Complaint  Patient presents with   Hip Pain    Elizabeth Hubbard is a 89 y.o. female.  Is a 89 year old female with a past medical history of hypertension, hyperlipidemia and prior stroke without residual deficits presenting to the emergency department after a fall.  The patient states on Wednesday she was trying to get out of bed and slid out of bed and fell on the ground.  She denies hitting her head or losing consciousness.  She states that she initially had no pain and was able to get herself up and ambulate after the fall.  She states that yesterday her daughter picked her up and brought her to [facility] and she was still feeling well however today when she tried to get up and get around she was having significant pain on her left side is worse with any type of movement.  She denies any numbness or weakness or shortness of breath.  She denies any dysuria or hematuria.  She denied any dizziness prior to her fall.  She states she has not taken anything for pain yet.  The history is provided by the patient and a relative.  Hip Pain       Home Medications Prior to Admission medications   Medication Sig Start Date End Date Taking? Authorizing Provider  lidocaine (LIDODERM) 5 % Place 1 patch onto the skin daily. Remove & Discard patch within 12 hours or as directed by MD 02/21/23  Yes Theresia Lo, Benetta Spar K, DO  amLODipine (NORVASC) 5 MG tablet Take 5 mg by mouth daily.    [provider]  atorvastatin (LIPITOR) 40 MG tablet Take 40 mg by mouth daily.    [provider]  docusate sodium (COLACE) 100 MG capsule Take 1 capsule (100 mg total) by mouth every 12 (twelve) hours. 05/10/16   Zadie Rhine, MD  ferrous sulfate 325 (65 FE) MG tablet Take 325 mg by mouth daily with breakfast.    [provider]  Multiple  Vitamin (MULTIVITAMIN WITH MINERALS) TABS tablet Take 1 tablet by mouth daily.    [provider]  traMADol (ULTRAM) 50 MG tablet Take 1 tablet (50 mg total) by mouth every 8 (eight) hours as needed for moderate pain or severe pain. 05/10/16   Zadie Rhine, MD      Allergies    Aspirin, Diovan hct [valsartan-hydrochlorothiazide], Doxycycline, Keflex [cephalexin], Lisinopril, Penicillins, Percocet [oxycodone-acetaminophen], Tetracyclines & related, and Tylenol with codeine #3 [acetaminophen-codeine]    Review of Systems   Review of Systems  Physical Exam Updated Vital Signs BP (!) 148/65 (BP Location: Left Arm)   Pulse 82   Temp 98.2 F (36.8 C) (Oral)   Resp 20   SpO2 100%  Physical Exam Vitals and nursing note reviewed.  Constitutional:      General: She is not in acute distress.    Appearance: Normal appearance.  HENT:     Head: Normocephalic and atraumatic.     Nose: Nose normal.     Mouth/Throat:     Mouth: Mucous membranes are moist.     Pharynx: Oropharynx is clear.  Eyes:     Extraocular Movements: Extraocular movements intact.     Conjunctiva/sclera: Conjunctivae normal.  Neck:     Comments: No midline neck tenderness Cardiovascular:     Rate and Rhythm: Normal rate and regular rhythm.  Heart sounds: Normal heart sounds.  Pulmonary:     Effort: Pulmonary effort is normal.     Breath sounds: Normal breath sounds.  Abdominal:     General: Abdomen is flat.     Palpations: Abdomen is soft.     Tenderness: There is no abdominal tenderness.  Musculoskeletal:        General: Normal range of motion.     Cervical back: Normal range of motion and neck supple.     Comments: No midline back tenderness Left-sided lateral rib tenderness to palpation without overlying skin changes Pelvis stable, nontender No bony tenderness to bilateral upper and lower extremities, no pain in left hip with internal/external rotation, some reproducible pain with hip flexion   Skin:    General: Skin is warm and dry.  Neurological:     General: No focal deficit present.     Mental Status: She is alert and oriented to person, place, and time.  Psychiatric:        Mood and Affect: Mood normal.        Behavior: Behavior normal.     ED Results / Procedures / Treatments   Labs (all labs ordered are listed, but only abnormal results are displayed) Labs Reviewed - No data to display  EKG None  Radiology DG Ribs Unilateral W/Chest Left  Result Date: 02/21/2023 CLINICAL DATA:  Recent fall with left-sided chest pain, initial encounter EXAM: LEFT RIBS AND CHEST - 3+ VIEW COMPARISON:  None Available. FINDINGS: Cardiac shadow is within normal limits. The lungs are well aerated bilaterally. No pneumothorax or focal infiltrate is seen. Mildly angulated fracture of the left tenth rib is noted laterally. No other fracture is seen. IMPRESSION: Left tenth rib fracture without complicating factors. Electronically Signed   By: Alcide Clever M.D.   On: 02/21/2023 19:04   DG Hip Unilat With Pelvis 2-3 Views Left  Result Date: 02/21/2023 CLINICAL DATA:  Follow-up bad Wednesday night. Left lower abdominal and hip pain. Worse when trying to walk. EXAM: DG HIP (WITH OR WITHOUT PELVIS) 2-3V LEFT COMPARISON:  CT abdomen and pelvis 05/08/2016 FINDINGS: There is diffuse decreased bone mineralization. Moderate bilateral femoroacetabular joint space narrowing. No acute fracture is seen. Mild pubic symphysis joint space narrowing. Moderate inferior right sacroiliac subchondral sclerosis with mild inferior right-greater-than-left sacroiliac joint space narrowing, similar to prior CT. Likely ventriculoperitoneal shunt catheter tubing overlies the pelvis with tip overlying the right ilium. This was also present on prior 2017 CT. Moderate atherosclerotic calcifications. IMPRESSION: 1. Within the limitations of diffuse decreased bone mineralization, no acute fracture is seen. 2. 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 3. Moderate inferior right sacroiliac subchondral sclerosis likely degenerative change, similar to prior CT. Electronically Signed   By: Neita Garnet M.D.   On: 02/21/2023 17:35    Procedures Procedures    Medications Ordered in ED Medications  lidocaine (LIDODERM) 5 % 1-3 patch (1 patch Transdermal Patch Applied 02/21/23 1850)  acetaminophen (TYLENOL) tablet 1,000 mg (1,000 mg Oral Given 02/21/23 1848)    ED Course/ Medical Decision Making/ A&P Clinical Course as of 02/21/23 2006  Thu Feb 21, 2023  1917 L 10th Rib fracture on XR [VK]  2003 Patient's pain is well-controlled.  She will be given incentive spirometer and is stable for discharge home. [VK]    Clinical Course User Index [VK] Rexford Maus, DO  Medical Decision Making This patient presents to the ED with chief complaint(s) of left-sided pain after fall with pertinent past medical history of hypertension, hyperlipidemia, prior stroke without residual deficits which further complicates the presenting complaint. The complaint involves an extensive differential diagnosis and also carries with it a high risk of complications and morbidity.    The differential diagnosis includes patient had no head trauma and has not been having any neurologic symptoms since the fall making ICH or mass effect unlikely, she does have significant tenderness to her left ribs considering rib fracture, as well as pain with hip flexion concern for possible hip or pelvis fracture, no other traumatic injuries seen on exam, no presyncopal symptoms making syncopal fall unlikely  Additional history obtained: Additional history obtained from family Records reviewed Care Everywhere/External Records and Primary Care Documents  ED Course and Reassessment: On patient's arrival to the emergency department she was hemodynamically stable in no acute distress.  She was initially evaluated  by triage and had left hip x-ray performed.  X-ray showed osteoarthritis but no acute traumatic injuries.  The patient does have significant rib tenderness for me on exam and will have rib x-rays.  She will be given Tylenol and lidocaine patch will be closely reassessed.  Independent labs interpretation:  N/A  Independent visualization of imaging: - I independently visualized the following imaging with scope of interpretation limited to determining acute life threatening conditions related to emergency care: L rib XR, L hip XR, which revealed L 10th rib fx, no acute traumatic injury of L hip  Consultation: - Consulted or discussed management/test interpretation w/ external professional: N/A  Consideration for admission or further workup: Patient has no emergent conditions requiring admission or further work-up at this time and is stable for discharge home with primary care follow-up  Social Determinants of health: N/A    Amount and/or Complexity of Data Reviewed Radiology: ordered.  Risk OTC drugs. Prescription drug management.          Final Clinical Impression(s) / ED Diagnoses Final diagnoses:  Closed fracture of one rib of left side, initial encounter    Rx / DC Orders ED Discharge Orders          Ordered    lidocaine (LIDODERM) 5 %  Every 24 hours        02/21/23 2004              Rexford Maus, DO 02/21/23 2006

## 2023-02-21 NOTE — Discharge Instructions (Addendum)
You were seen in the emergency department for your left-sided pain after your fall.  Your x-ray did show that you fractured your left 10th rib.  You can continue to take Tylenol every 6 hours as needed for pain as well as use lidocaine patches.  You can use Motrin as well though this can cause stomach upset and you should take an antacid with this if you are going to take Motrin.  I have given you an incentive spirometer that you should use about once an hour daily to make sure that you are taking good breaths to help prevent pneumonia.  You can follow-up with your primary doctor in the next few days to have your symptoms rechecked.  You should return to the emergency department if you have significantly worsening pain, severe shortness of breath, fevers or any other new or concerning symptoms.

## 2023-02-21 NOTE — ED Triage Notes (Signed)
Fall out of bed on Wednesday night. Today c/o left sided lower abd/ hip pain, worse when trying to walk

## 2023-02-21 NOTE — ED Notes (Signed)
Spoke to Pt's daughter who stated she is on her way to pick her mother up.

## 2023-02-21 NOTE — Progress Notes (Signed)
RT Note: Pt. Instructed/Educated on the use of Incentive Spirometry from having a fall out of bed, able to use independently after initial instruction with minimal follow-up, stressed deep breath with best use prior to getting up, moving. States understanding along with being able to teach back, RN made aware.

## 2024-03-22 ENCOUNTER — Encounter (HOSPITAL_BASED_OUTPATIENT_CLINIC_OR_DEPARTMENT_OTHER): Payer: Self-pay

## 2024-03-22 ENCOUNTER — Emergency Department (HOSPITAL_BASED_OUTPATIENT_CLINIC_OR_DEPARTMENT_OTHER)
Admission: EM | Admit: 2024-03-22 | Discharge: 2024-03-22 | Disposition: A | Discharge status: Home / Self Care | Attending: Emergency Medicine | Admitting: Emergency Medicine

## 2024-03-22 ENCOUNTER — Other Ambulatory Visit: Payer: Self-pay

## 2024-03-22 DIAGNOSIS — S50862A Insect bite (nonvenomous) of left forearm, initial encounter: Secondary | ICD-10-CM | POA: Diagnosis present

## 2024-03-22 DIAGNOSIS — W57XXXA Bitten or stung by nonvenomous insect and other nonvenomous arthropods, initial encounter: Secondary | ICD-10-CM | POA: Diagnosis not present

## 2024-03-22 NOTE — ED Triage Notes (Signed)
 Patient arrives POV with complaints of itchy arm related to insect bites sustained while in her yard today. Patients daughter also reports she has been complaining of abdominal pain, but the patient does not reports any abdominal pain at this time.

## 2024-03-22 NOTE — Discharge Instructions (Signed)
 Return for confusion, swelling, redness, fevers or new concerns. You can apply topical hydrocortisone ointment from the pharmacy for future bites twice a day for 5 days.

## 2024-03-22 NOTE — ED Provider Notes (Signed)
 [place] EMERGENCY DEPARTMENT AT [facility] Provider Note   CSN: [id number] Arrival date & time: 03/22/24  1322     Patient presents with: Insect Bite   Elizabeth Hubbard is a 89 y.o. female.   Patient presents with itchy left arm from insect bites being outside today.  Upon arrival her bites and itching has resolved.  She had abdominal pain earlier today that is also resolved.  No fevers or chills.  Patient's daughter is with her in the room.  Patient is visiting from [place] .  No urinary symptoms.  Patient at baseline.  The history is provided by the patient and a relative.       Prior to Admission medications   Medication Sig Start Date End Date Taking? Authorizing Provider  amLODipine (NORVASC) 5 MG tablet Take 5 mg by mouth daily.    [provider]  atorvastatin (LIPITOR) 40 MG tablet Take 40 mg by mouth daily.    [provider]  docusate sodium  (COLACE) 100 MG capsule Take 1 capsule (100 mg total) by mouth every 12 (twelve) hours. 05/10/16   Midge Golas, MD  ferrous sulfate 325 (65 FE) MG tablet Take 325 mg by mouth daily with breakfast.    [provider]  lidocaine  (LIDODERM ) 5 % Place 1 patch onto the skin daily. Remove & Discard patch within 12 hours or as directed by MD 02/21/23   Kingsley, Victoria K, DO  Multiple Vitamin (MULTIVITAMIN WITH MINERALS) TABS tablet Take 1 tablet by mouth daily.    [provider]  traMADol  (ULTRAM ) 50 MG tablet Take 1 tablet (50 mg total) by mouth every 8 (eight) hours as needed for moderate pain or severe pain. 05/10/16   Midge Golas, MD    Allergies: Aspirin, Diovan hct [valsartan-hydrochlorothiazide], Doxycycline, Keflex [cephalexin], Lisinopril, Penicillins, Percocet [oxycodone-acetaminophen ], Tetracyclines & related, and Tylenol  with codeine #3 [acetaminophen -codeine]    Review of Systems  Constitutional:  Negative for chills and fever.  HENT:  Negative for congestion.    Eyes:  Negative for visual disturbance.  Respiratory:  Negative for shortness of breath.   Cardiovascular:  Negative for chest pain.  Gastrointestinal:  Negative for abdominal pain and vomiting.  Genitourinary:  Negative for dysuria and flank pain.  Musculoskeletal:  Negative for back pain, neck pain and neck stiffness.  Skin:  Positive for rash.  Neurological:  Negative for light-headedness and headaches.    Updated Vital Signs BP (!) 176/67   Pulse 71   Temp 97.9 F (36.6 C) (Oral)   Resp 20   Ht 5' (1.524 m)   Wt 48 kg   SpO2 98%   BMI 20.67 kg/m   Physical Exam Vitals and nursing note reviewed.  Constitutional:      General: She is not in acute distress.    Appearance: She is well-developed.  HENT:     Head: Normocephalic and atraumatic.     Mouth/Throat:     Mouth: Mucous membranes are moist.  Eyes:     General:        Right eye: No discharge.        Left eye: No discharge.     Conjunctiva/sclera: Conjunctivae normal.  Neck:     Trachea: No tracheal deviation.  Cardiovascular:     Rate and Rhythm: Normal rate and regular rhythm.  Pulmonary:     Effort: Pulmonary effort is normal.     Breath sounds: Normal breath sounds.  Abdominal:     General: There  is no distension.     Palpations: Abdomen is soft.     Tenderness: There is no abdominal tenderness. There is no guarding.  Musculoskeletal:        General: No swelling or tenderness.     Cervical back: Normal range of motion and neck supple. No rigidity.  Skin:    General: Skin is warm.     Capillary Refill: Capillary refill takes less than 2 seconds.     Findings: No rash.  Neurological:     General: No focal deficit present.     Mental Status: She is alert.     Cranial Nerves: No cranial nerve deficit.  Psychiatric:        Mood and Affect: Mood normal.     (all labs ordered are listed, but only abnormal results are displayed) Labs Reviewed - No data to display  EKG: None  Radiology: No  results found.   Procedures   Medications Ordered in the ED - No data to display                                  Medical Decision Making  Patient presents for assessment of transient symptoms that have fortunately resolved.  No signs of significant rash or obvious insect bite at this time.  Daughter and patient did have multiple bumps earlier today that resolved.  Discussed topical steroids as needed.  Patient had abdominal pain however this is resolved as well abdomen soft nontender no evidence of sinusitis, bowel obstruction or other more significant pathology.  No urine symptoms to require urine testing.  Discussed supportive care and reasons to return family comfortable plan.  Patient's blood pressure mild elevated she has blood pressure meds to take at home. No signs of stroke clinically.     Final diagnoses:  Insect bite of left forearm, initial encounter    ED Discharge Orders     None          Tonia Chew, MD 03/22/24 1442
